# Patient Record
Sex: Male | Born: 1990 | Race: Black or African American | Hispanic: No | Marital: Single | Smoking: Never smoker
Health system: Southern US, Community
[De-identification: ages and names within clinical notes are randomized; demographics above are authoritative.]

---
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
## 2014-11-15 ENCOUNTER — Emergency Department (HOSPITAL_COMMUNITY)
Admission: EM | Admit: 2014-11-15 | Discharge: 2014-11-15 | Disposition: A | Discharge status: Home / Self Care | Payer: Self-pay | Attending: Emergency Medicine | Admitting: Emergency Medicine

## 2014-11-15 ENCOUNTER — Encounter (HOSPITAL_COMMUNITY): Payer: Self-pay | Admitting: Emergency Medicine

## 2014-11-15 DIAGNOSIS — R21 Rash and other nonspecific skin eruption: Secondary | ICD-10-CM | POA: Insufficient documentation

## 2014-11-15 DIAGNOSIS — L299 Pruritus, unspecified: Secondary | ICD-10-CM | POA: Insufficient documentation

## 2014-11-15 MED ORDER — HYDROXYZINE HCL 25 MG PO TABS: 25.0000 mg | ORAL_TABLET | Freq: Four times a day (QID) | ORAL | Status: DC

## 2014-11-15 NOTE — Discharge Instructions (Signed)
In addition, you may apply a topical hydrocortisone ointment to all affected areas except for the face.

## 2014-11-15 NOTE — ED Notes (Signed)
Pt from home for eval of itching and rash noted to bilateral upper extremities and also trunk and back, pt denies any sob or cp at this time. Reports no pain. Airway intact.

## 2014-11-15 NOTE — ED Provider Notes (Signed)
CSN: [id number]     Arrival date & time 11/15/14  1208 History  This chart was scribed for Evan EmeryNicole Caellum Mancil, PA-C, working with Evan CoreNathan Pickering, MD by Elon SpannerGarrett Cook, ED Scribe. This patient was seen in room TR07C/TR07C and the patient's care was started at 12:29 PM.   Chief Complaint  Patient presents with  . Rash   The history is provided by the patient. No language interpreter was used.   HPI Comments: Evan Herrera Kitson is a 24 y.o. male who presents to the Emergency Department complaining of an intermittently itching rash onset 2 days ago on his BLE's, chest, trunk, upper back, and lower left leg.  Patient has not taken any OTC medications.  Patient reports he recently stayed at his grandmother's house the night before onset but reports the grandmother has not had similar complaints.  Patient denies rash in mouth, palms, or soles of feet.    History reviewed. No pertinent past medical history. History reviewed. No pertinent past surgical history. No family history on file. History  Substance Use Topics  . Smoking status: Never Smoker   . Smokeless tobacco: Not on file  . Alcohol Use: No    Review of Systems  Skin: Positive for rash.  All other systems reviewed and are negative.     Allergies  Review of patient's allergies indicates no known allergies.  Home Medications   Prior to Admission medications   Not on File   BP 132/79 mmHg  Pulse 64  Temp(Src) 98 F (36.7 C) (Oral)  Resp 16  Ht 5\' 9"  (1.753 m)  Wt 150 lb (68.04 kg)  BMI 22.14 kg/m2  SpO2 100% Physical Exam  Constitutional: He is oriented to person, place, and time. He appears well-developed and well-nourished. No distress.  HENT:  Head: Normocephalic and atraumatic.  Eyes: Conjunctivae and EOM are normal.  Neck: Neck supple. No tracheal deviation present.  Cardiovascular: Normal rate.   Pulmonary/Chest: Effort normal. No stridor. No respiratory distress.  Abdominal: Soft.  Musculoskeletal: Normal range of  motion.  Neurological: He is alert and oriented to person, place, and time.  Skin: Skin is warm and dry. Rash noted.  Raised erythematous lesions consistent with bug bites to bilateral upper extremities, lower extremities and trunk. No warmth, TTP or discharge. Lesions are blanchable; sparing palms, soles and mucous membranes   Psychiatric: He has a normal mood and affect. His behavior is normal.  Nursing note and vitals reviewed.   ED Course  Procedures (including critical care time)  DIAGNOSTIC STUDIES: Oxygen Saturation is 100% on RA, normal by my interpretation.    COORDINATION OF CARE:  12:34 PM Discussed treatment plan with patient at bedside.  Patient acknowledges and agrees with plan.    Labs Review Labs Reviewed - No data to display  Imaging Review No results found.   EKG Interpretation None      MDM   Final diagnoses:  Rash and nonspecific skin eruption    Filed Vitals:   11/15/14 1223  BP: 132/79  Pulse: 64  Temp: 98 F (36.7 C)  TempSrc: Oral  Resp: 16  Height: 5\' 9"  (1.753 m)  Weight: 150 lb (68.04 kg)  SpO2: 100%   Evan Herrera Apo is a pleasant 24 y.o. male presenting with itching and rash to exposed area of skin after patient spent the night at his grandmother's house. No other household members are affected. Explained to him that this may be bug bites, possibly bedbugs. Advised him to have his  grandmother obtain a exterminator evaluation of the home. Patient has no signs of secondary infection. Advised him to apply topical hydrocortisone ointment to the area, without a scheduled a return precautions for infection.  Evaluation does not show pathology that would require ongoing emergent intervention or inpatient treatment. Pt is hemodynamically stable and mentating appropriately. Discussed findings and plan with patient/guardian, who agrees with care plan. All questions answered. Return precautions discussed and outpatient follow up given.   Discharge  Medication List as of 11/15/2014 12:35 PM    START taking these medications   Details  hydrOXYzine (ATARAX/VISTARIL) 25 MG tablet Take 1 tablet (25 mg total) by mouth every 6 (six) hours., Starting 11/15/2014, Until Discontinued, Print       I personally performed the services described in this documentation, which was scribed in my presence. The recorded information has been reviewed and is accurate.      Evan Emery, PA-C 11/15/14 1636  Evan Core, MD 11/16/14 [phone]

## 2014-11-28 ENCOUNTER — Emergency Department (HOSPITAL_COMMUNITY)
Admission: EM | Admit: 2014-11-28 | Discharge: 2014-11-28 | Disposition: A | Discharge status: Home / Self Care | Payer: Self-pay | Attending: Emergency Medicine | Admitting: Emergency Medicine

## 2014-11-28 ENCOUNTER — Emergency Department (HOSPITAL_COMMUNITY): Payer: Self-pay

## 2014-11-28 ENCOUNTER — Encounter (HOSPITAL_COMMUNITY): Payer: Self-pay | Admitting: Physical Medicine and Rehabilitation

## 2014-11-28 DIAGNOSIS — Y998 Other external cause status: Secondary | ICD-10-CM | POA: Insufficient documentation

## 2014-11-28 DIAGNOSIS — S81812A Laceration without foreign body, left lower leg, initial encounter: Secondary | ICD-10-CM | POA: Insufficient documentation

## 2014-11-28 DIAGNOSIS — Y288XXA Contact with other sharp object, undetermined intent, initial encounter: Secondary | ICD-10-CM | POA: Insufficient documentation

## 2014-11-28 DIAGNOSIS — Y9361 Activity, american tackle football: Secondary | ICD-10-CM | POA: Insufficient documentation

## 2014-11-28 DIAGNOSIS — Z23 Encounter for immunization: Secondary | ICD-10-CM | POA: Insufficient documentation

## 2014-11-28 DIAGNOSIS — T148XXA Other injury of unspecified body region, initial encounter: Secondary | ICD-10-CM

## 2014-11-28 DIAGNOSIS — Y92321 Football field as the place of occurrence of the external cause: Secondary | ICD-10-CM | POA: Insufficient documentation

## 2014-11-28 MED ORDER — TETANUS-DIPHTH-ACELL PERTUSSIS 5-2.5-18.5 LF-MCG/0.5 IM SUSP
0.5000 mL | Freq: Once | INTRAMUSCULAR | Status: AC
  Administered 2014-11-28: 0.5 mL via INTRAMUSCULAR
  Filled 2014-11-28: qty 0.5

## 2014-11-28 NOTE — ED Notes (Signed)
Pt presents to department for evaluation of laceration to L lower leg. States he was playing football and struck leg on metal object. Occurred x2 days ago. Tetanus up to date. Pt reports increased pain to L lower leg today.

## 2014-11-28 NOTE — Discharge Instructions (Signed)
Please read and follow all provided instructions.  Your diagnoses today include:  1. Laceration of left lower leg, initial encounter   2. Open wound     Tests performed today include:  X-ray of the affected area that did not show any foreign bodies or broken bones  Vital signs. See below for your results today.   Medications prescribed:   None  Take any prescribed medications only as directed.   Home care instructions:  Follow any educational materials and wound care instructions contained in this packet.   Keep affected area above the level of your heart when possible to minimize swelling. Wash area gently twice a day with warm soapy water. Do not apply alcohol or hydrogen peroxide. Cover the area if it draining or weeping.   Follow-up instructions: Wound recheck: Return to the Emergency Department or see your primary care care doctor in 2 days for a recheck of your wound.    Return instructions:  Return to the Emergency Department if you have:  Fever  Worsening pain  Worsening swelling of the wound  Pus draining from the wound  Redness of the skin that moves away from the wound, especially if it streaks away from the affected area   Any other emergent concerns  Your vital signs today were: BP 130/76 mmHg   Pulse 71   Temp(Src) 97.9 F (36.6 C) (Oral)   Resp 18   Ht 5\' 9"  (1.753 m)   Wt 145 lb (65.772 kg)   BMI 21.40 kg/m2   SpO2 100% If your blood pressure (BP) was elevated above 135/85 this visit, please have this repeated by your doctor within one month. --------------

## 2014-11-28 NOTE — ED Provider Notes (Signed)
CSN: [id number]     Arrival date & time 11/28/14  1418 History   First MD Initiated Contact with Patient 11/28/14 1636     Chief Complaint  Patient presents with  . Laceration     (Consider location/radiation/quality/duration/timing/severity/associated sxs/prior Treatment) HPI Comments: Patient with no significant past medical history -- presents with c/o left lower leg laceration occurring approximately 48 hours ago. Plane was playing football and cut his leg on a metal gate. Patient states that he rinsed the wound out at home with peroxide. In contradiction to the nursing note, patient tells me that he is uncertain of last tetanus shot and that he has not had any increased pain in the area today. He has not noted worsening redness, worsening swelling, or pus draining from the wound. He has not had any streaking red lines on his legs and has not had any fever. He states that he comes in today for evaluation at the urging of his family. No other concerns or injury.  The history is provided by the patient.    History reviewed. No pertinent past medical history. History reviewed. No pertinent past surgical history. History reviewed. No pertinent family history. History  Substance Use Topics  . Smoking status: Never Smoker   . Smokeless tobacco: Not on file  . Alcohol Use: No    Review of Systems  Constitutional: Negative for activity change.  Musculoskeletal: Positive for myalgias (mild). Negative for back pain, joint swelling, arthralgias, gait problem and neck pain.  Skin: Positive for wound.  Neurological: Negative for weakness and numbness.      Allergies  Review of patient's allergies indicates no known allergies.  Home Medications   Prior to Admission medications   Medication Sig Start Date End Date Taking? Authorizing Provider  hydrOXYzine (ATARAX/VISTARIL) 25 MG tablet Take 1 tablet (25 mg total) by mouth every 6 (six) hours. 11/15/14   Nicole Pisciotta, PA-C   BP  130/76 mmHg  Pulse 71  Temp(Src) 97.9 F (36.6 C) (Oral)  Resp 18  Ht  (1.753 m)  Wt 145 lb (65.772 kg)  BMI 21.40 kg/m2  SpO2 100% Physical Exam  Constitutional: He appears well-developed and well-nourished.  HENT:  Head: Normocephalic and atraumatic.  Eyes: Conjunctivae are normal.  Neck: Normal range of motion. Neck supple.  Cardiovascular: Normal pulses.   Musculoskeletal: He exhibits tenderness. He exhibits no edema.  Neurological: He is alert. No sensory deficit.  Motor, sensation, and vascular distal to the injury is fully intact.   Skin: Skin is warm and dry.  Approximately 8 cm, irregular laceration with superficial abrasions inferiorly to the left anterior lower leg. Wound is full-thickness and into the subcutaneous tissues. No obvious fascial or muscle injury. Sensation intact distally.  Psychiatric: He has a normal mood and affect.  Nursing note and vitals reviewed.           ED Course  Procedures (including critical care time) Labs Review Labs Reviewed - No data to display  Imaging Review Dg Tibia/fibula Left  11/28/2014   CLINICAL DATA:  Left leg injury playing football 2 days ago, laceration along lateral aspect tibia and fibula  EXAM: LEFT TIBIA AND FIBULA - 2 VIEW  COMPARISON:  None.  FINDINGS: Two views of left tibia-fibula submitted. No acute fracture or subluxation. No radiopaque foreign body. Please note the tip of distal fibula is not included on frontal view.  IMPRESSION: Negative.   Electronically Signed   By: Natasha Mead M.D.   On:  11/28/2014 15:07     EKG Interpretation None       5:10 PM Patient seen and examined. Tdap ordered.   Vital signs reviewed and are as follows: BP 130/76 mmHg  Pulse 71  Temp(Src) 97.9 F (36.6 C) (Oral)  Resp 18  Ht 5\' 9"  (1.753 m)  Wt 145 lb (65.772 kg)  BMI 21.40 kg/m2  SpO2 100%  I recommended that we debride some of the nonviable tissue surrounding the wound. Patient is adamant that he does not  want at this done. I explained possible increased possibility of wound infection without debridement of devitalized tissue. He continues to refuse.  Nurse to clean and bandage wound.   Pt urged to return with worsening pain, worsening swelling, expanding area of redness or streaking up extremity, fever, or any other concerns. Pt verbalizes understanding and agrees with plan.   MDM   Final diagnoses:  Laceration of left lower leg, initial encounter  Open wound   Open wound, left lower leg, does not appear to be overtly infected at this time. It is too late to close the wound at this time due to risk of wound infection. Offered debridement of the devitalized tissue surrounding wound, patient adamantly refuses. No immunocompromise. No indications for antibiotic use. Encouraged recheck in 2 days. Tetanus updated. Counseled on wound care and signs and symptoms of wound infection and need to return if these occur.    Renne CriglerJoshua Yassmine Tamm, PA-C 11/28/14 1715  Elwin MochaBlair Walden, MD 11/28/14 [phone]

## 2014-11-28 NOTE — ED Notes (Signed)
Linear open wound noted to anterior left shin, bleeding controlled, no redness, swelling, or drainage noted.

## 2015-01-02 ENCOUNTER — Emergency Department (HOSPITAL_COMMUNITY)
Admission: EM | Admit: 2015-01-02 | Discharge: 2015-01-02 | Disposition: A | Discharge status: Home / Self Care | Payer: 59

## 2016-08-01 ENCOUNTER — Encounter (HOSPITAL_COMMUNITY): Payer: Self-pay | Admitting: Emergency Medicine

## 2016-08-01 ENCOUNTER — Ambulatory Visit (HOSPITAL_COMMUNITY)
Admission: EM | Admit: 2016-08-01 | Discharge: 2016-08-01 | Disposition: A | Discharge status: Home / Self Care | Payer: 59 | Attending: Emergency Medicine | Admitting: Emergency Medicine

## 2016-08-01 DIAGNOSIS — J029 Acute pharyngitis, unspecified: Secondary | ICD-10-CM | POA: Diagnosis not present

## 2016-08-01 DIAGNOSIS — Z0001 Encounter for general adult medical examination with abnormal findings: Secondary | ICD-10-CM | POA: Insufficient documentation

## 2016-08-01 DIAGNOSIS — J069 Acute upper respiratory infection, unspecified: Secondary | ICD-10-CM | POA: Diagnosis not present

## 2016-08-01 LAB — POCT RAPID STREP A: Streptococcus, Group A Screen (Direct): NEGATIVE

## 2016-08-01 MED ORDER — PREDNISONE 10 MG (21) PO TBPK: 10.0000 mg | ORAL_TABLET | Freq: Every day | ORAL | 0 refills | Status: DC

## 2016-08-01 MED ORDER — AZITHROMYCIN 250 MG PO TABS: 250.0000 mg | ORAL_TABLET | Freq: Every day | ORAL | 0 refills | Status: DC

## 2016-08-01 NOTE — ED Provider Notes (Signed)
CSN: [id number]     Arrival date & time 08/01/16  1756 History   First MD Initiated Contact with Patient 08/01/16 1911     Chief Complaint  Patient presents with  . Sore Throat   (Consider location/radiation/quality/duration/timing/severity/associated sxs/prior Treatment) Pt states that he has had nasal congestion, ear pain, throat pain since thurs. Intermit headaches none currently. Has taken day quil , and other OTC with no relief. Denies any fevers. Denies anyone at home sick.       History reviewed. No pertinent past medical history. History reviewed. No pertinent surgical history. No family history on file. Social History  Substance Use Topics  . Smoking status: Never Smoker  . Smokeless tobacco: Never Used  . Alcohol use No    Review of Systems  HENT: Positive for congestion, ear pain, mouth sores, postnasal drip, rhinorrhea, sinus pain, sinus pressure, sneezing, sore throat and trouble swallowing.   Eyes: Negative.   Respiratory: Positive for cough.   Cardiovascular: Negative.   Gastrointestinal: Negative.   Skin: Negative.   Neurological: Negative.     Allergies  Patient has no known allergies.  Home Medications   Prior to Admission medications   Medication Sig Start Date End Date Taking? Authorizing Provider  azithromycin (ZITHROMAX) 250 MG tablet Take 1 tablet (250 mg total) by mouth daily. Take first 2 tablets together, then 1 every day until finished. 08/01/16   Tobi BastosMelanie A Kathryne Ramella, NP  hydrOXYzine (ATARAX/VISTARIL) 25 MG tablet Take 1 tablet (25 mg total) by mouth every 6 (six) hours. 11/15/14   Nicole Pisciotta, PA-C  predniSONE (STERAPRED UNI-PAK 21 TAB) 10 MG (21) TBPK tablet Take 1 tablet (10 mg total) by mouth daily. Take 6 tabs by mouth daily  for 2 days, then 5 tabs for 2 days, then 4 tabs for 2 days, then 3 tabs for 2 days, 2 tabs for 2 days, then 1 tab by mouth daily for 2 days 08/01/16   Tobi BastosMelanie A Giannina Bartolome, NP   Meds Ordered and Administered this  Visit  Medications - No data to display  BP 148/77 (BP Location: Left Arm)   Pulse 74   Temp 98.2 F (36.8 C) (Oral)   Resp 17   Ht 5\' 9"  (1.753 m)   Wt 158 lb (71.7 kg)   SpO2 100%   BMI 23.33 kg/m  No data found.   Physical Exam  Constitutional: He appears well-developed and well-nourished.  HENT:  Right Ear: External ear normal.  Left Ear: External ear normal.  Enlarged TM, erythema with multiple pustula to throat, swelling noted, Enlarged lymph lateral lt ear,   Cardiovascular: Normal rate and regular rhythm.   Pulmonary/Chest: Effort normal and breath sounds normal.  Neurological: He is alert.  Skin: Skin is warm. Capillary refill takes less than 2 seconds.    Urgent Care Course   Clinical Course     Procedures (including critical care time)  Labs Review Labs Reviewed - No data to display  Imaging Review No results found.           MDM   1. Acute pharyngitis, unspecified etiology   2. Upper respiratory tract infection, unspecified type    Discussed using a netti pot or humidifier Push fluids May take tylenol or motrin PRN for pain May use OTC nasal spray      Tobi BastosMelanie A Faline Langer, NP 08/01/16 1922

## 2016-08-01 NOTE — ED Triage Notes (Signed)
Pt. Stated, I've got a bad sore throat and its making me not about to eat. Started thursday

## 2016-08-04 LAB — CULTURE, GROUP A STREP (THRC)

## 2017-04-27 ENCOUNTER — Emergency Department (HOSPITAL_COMMUNITY)
Admission: EM | Admit: 2017-04-27 | Discharge: 2017-04-27 | Disposition: A | Discharge status: Home / Self Care | Payer: No Typology Code available for payment source | Attending: Emergency Medicine | Admitting: Emergency Medicine

## 2017-04-27 ENCOUNTER — Encounter (HOSPITAL_COMMUNITY): Payer: Self-pay | Admitting: Emergency Medicine

## 2017-04-27 DIAGNOSIS — Y999 Unspecified external cause status: Secondary | ICD-10-CM | POA: Diagnosis not present

## 2017-04-27 DIAGNOSIS — M79602 Pain in left arm: Secondary | ICD-10-CM | POA: Diagnosis present

## 2017-04-27 DIAGNOSIS — Z5321 Procedure and treatment not carried out due to patient leaving prior to being seen by health care provider: Secondary | ICD-10-CM | POA: Diagnosis not present

## 2017-04-27 DIAGNOSIS — Y9389 Activity, other specified: Secondary | ICD-10-CM | POA: Insufficient documentation

## 2017-04-27 DIAGNOSIS — Y92410 Unspecified street and highway as the place of occurrence of the external cause: Secondary | ICD-10-CM | POA: Insufficient documentation
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
## 2017-05-05 ENCOUNTER — Encounter (HOSPITAL_COMMUNITY): Payer: Self-pay | Admitting: *Deleted

## 2017-05-05 ENCOUNTER — Ambulatory Visit (HOSPITAL_COMMUNITY)
Admission: EM | Admit: 2017-05-05 | Discharge: 2017-05-05 | Disposition: A | Discharge status: Home / Self Care | Payer: 59 | Attending: Family | Admitting: Family

## 2017-05-05 DIAGNOSIS — R369 Urethral discharge, unspecified: Secondary | ICD-10-CM | POA: Insufficient documentation

## 2017-05-05 DIAGNOSIS — Z202 Contact with and (suspected) exposure to infections with a predominantly sexual mode of transmission: Secondary | ICD-10-CM

## 2017-05-05 DIAGNOSIS — L608 Other nail disorders: Secondary | ICD-10-CM | POA: Insufficient documentation

## 2017-05-05 DIAGNOSIS — Z113 Encounter for screening for infections with a predominantly sexual mode of transmission: Secondary | ICD-10-CM

## 2017-05-05 DIAGNOSIS — M79674 Pain in right toe(s): Secondary | ICD-10-CM | POA: Insufficient documentation

## 2017-05-05 MED ORDER — AZITHROMYCIN 250 MG PO TABS
1000.0000 mg | ORAL_TABLET | Freq: Once | ORAL | Status: AC
  Administered 2017-05-05: 1000 mg via ORAL

## 2017-05-05 MED ORDER — CEFTRIAXONE SODIUM 250 MG IJ SOLR
INTRAMUSCULAR | Status: AC
  Filled 2017-05-05: qty 250

## 2017-05-05 MED ORDER — AZITHROMYCIN 250 MG PO TABS
ORAL_TABLET | ORAL | Status: AC
  Filled 2017-05-05: qty 4

## 2017-05-05 MED ORDER — CEFTRIAXONE SODIUM 250 MG IJ SOLR
250.0000 mg | Freq: Once | INTRAMUSCULAR | Status: AC
  Administered 2017-05-05: 250 mg via INTRAMUSCULAR

## 2017-05-05 NOTE — ED Triage Notes (Signed)
Pt  Has  painfull  r  Big  Toe    For  A  Long  Time  Denies   Any   Symptoms   The  Nail  Is   discolred     Thick  And  Callused     Pt  Also  Wants  To  Be  Checked  For  Std  He  Sates  He  May  Have  A  Discharge  But is  Not  Sure

## 2017-05-05 NOTE — Discharge Instructions (Addendum)
You were given a shot of Rocephin (antibiotic) as well as oral medication (Zithromax) to treat possible STD infection. No sexual intercourse for at least 7 days. Will follow-up pending lab results. Also recommend referral to Podiatrist (Foot doctor) for evaluation of nail and to possible start anti-fungal medication. Call today to schedule appointment ASAP for a visit.

## 2017-05-05 NOTE — ED Provider Notes (Signed)
[facility]    CSN: [id number] Arrival date & time: 05/05/17  1001     History   Chief Complaint Chief Complaint  Patient presents with  . Toe Pain    HPI Evan Herrera is a 26 y.o. male.   26 year old male presents with right toe nail discoloration and occasional pain at base of toenail for over 3 months. Nail is raised, brown and disformed. He has not tried any topical medication for condition. No other nails affected. No history of nail fungus or athlete's foot.  Other main concern is penile discharge for past few days. Denies any fever, dysuria, rash, swelling or GI symptoms. Is sexually active- does use condoms. No other partners with symptoms. Requests to be treated for STD's today. No other chronic health issues. Takes no daily medication.    The history is provided by the patient.    History reviewed. No pertinent past medical history.  There are no active problems to display for this patient.   History reviewed. No pertinent surgical history.     Home Medications    Prior to Admission medications   Not on File    Family History History reviewed. No pertinent family history.  Social History Social History  Substance Use Topics  . Smoking status: Never Smoker  . Smokeless tobacco: Never Used  . Alcohol use No     Allergies   Patient has no known allergies.   Review of Systems Review of Systems  Constitutional: Negative for activity change, appetite change, chills, fatigue and fever.  HENT: Negative for mouth sores, postnasal drip and sore throat.   Respiratory: Negative for cough, chest tightness, shortness of breath and wheezing.   Gastrointestinal: Negative for abdominal pain, diarrhea, nausea and vomiting.  Genitourinary: Positive for discharge. Negative for decreased urine volume, difficulty urinating, dysuria, flank pain, frequency, genital sores, hematuria, penile pain, penile swelling, scrotal swelling, testicular pain and  urgency.  Musculoskeletal: Negative for arthralgias, gait problem, joint swelling and myalgias.  Skin: Positive for color change. Negative for wound.  Allergic/Immunologic: Negative for immunocompromised state.  Neurological: Negative for dizziness, seizures, syncope, weakness, light-headedness, numbness and headaches.  Hematological: Negative for adenopathy. Does not bruise/bleed easily.     Physical Exam Triage Vital Signs ED Triage Vitals  Enc Vitals Group     BP 05/05/17 1018 122/70     Pulse Rate 05/05/17 1018 78     Resp 05/05/17 1018 18     Temp 05/05/17 1018 98.6 F (37 C)     Temp Source 05/05/17 1018 Oral     SpO2 05/05/17 1018 100 %     Weight --      Height --      Head Circumference --      Peak Flow --      Pain Score 05/05/17 1019 3     Pain Loc --      Pain Edu? --      Excl. in GC? --    No data found.   Updated Vital Signs BP 122/70 (BP Location: Right Arm)   Pulse 78   Temp 98.6 F (37 C) (Oral)   Resp 18   SpO2 100%   Visual Acuity Right Eye Distance:   Left Eye Distance:   Bilateral Distance:    Right Eye Near:   Left Eye Near:    Bilateral Near:     Physical Exam  Constitutional: He is oriented to person, place, and time. He  appears well-developed and well-nourished. No distress.  HENT:  Head: Normocephalic and atraumatic.  Nose: Nose normal.  Mouth/Throat: Oropharynx is clear and moist.  Eyes: Conjunctivae and EOM are normal.  Neck: Normal range of motion.  Cardiovascular: Normal rate and regular rhythm.   Pulmonary/Chest: Effort normal.  Genitourinary:  Genitourinary Comments: Patient declines genital exam  Musculoskeletal: Normal range of motion. He exhibits deformity. He exhibits no tenderness.       Right foot: There is deformity (of nail). There is normal range of motion, no tenderness and no swelling.       Feet:  Nail on great toe of right foot is yellow to brown, raised completely about 1 inch from base with dry, flaking  appearance. Slightly tender along cuticle but minimal redness and no swelling seen. Has full range of motion of foot and toes. Good pulses. Unable to check capillary refill on great toe due to nail deformity. Other toes- capillary refill is normal. No neuro deficits noted.   Neurological: He is alert and oriented to person, place, and time. He has normal strength. No sensory deficit.  Skin: Skin is warm, dry and intact. Capillary refill takes less than 2 seconds.  Psychiatric: He has a normal mood and affect. His behavior is normal. Judgment and thought content normal.     UC Treatments / Results  Labs (all labs ordered are listed, but only abnormal results are displayed) Labs Reviewed  HIV ANTIBODY (ROUTINE TESTING)  RPR  URINE CYTOLOGY ANCILLARY ONLY    EKG  EKG Interpretation None       Radiology No results found.  Procedures Procedures (including critical care time)  Medications Ordered in UC Medications  azithromycin (ZITHROMAX) tablet 1,000 mg (1,000 mg Oral Given 05/05/17 1102)  cefTRIAXone (ROCEPHIN) injection 250 mg (250 mg Intramuscular Given 05/05/17 1102)     Initial Impression / Assessment and Plan / UC Course  I have reviewed the triage vital signs and the nursing notes.  Pertinent labs & imaging results that were available during my care of the patient were reviewed by me and considered in my medical decision making (see chart for details).    Discussed possible exposure to STD. Gave Rocephin 250mg  IM now and Zithromax 1g orally now for possible Chlamydia/Gonorrhea. Recommend no intercourse or oral sex for at least 7 days. Will treat for any other STD's pending lab results. Due to appearance of nail, recommend referral to Podiatrist (information provided) for evaluation of nail and to possibly start anti-fungal medication. Patient will call today to schedule appointment ASAP for a visit.     Final Clinical Impressions(s) / UC Diagnoses   Final diagnoses:    Toenail deformity  Abnormal penile discharge  Possible exposure to STD    New Prescriptions Discharge Medication List as of 05/05/2017 11:13 AM       Controlled Substance Prescriptions [place] Controlled Substance Registry consulted? Not Applicable   Sudie Grumbling, NP 05/05/17 [phone]

## 2017-05-05 NOTE — ED Notes (Signed)
Call back number verified and updated in EPIC... Adv pt to not have SI until lab results comeback neg.... Also adv pt lab results will be on MyChart; instructions given .... Pt verb understanding.   

## 2017-05-06 LAB — HIV ANTIBODY (ROUTINE TESTING W REFLEX): HIV Screen 4th Generation wRfx: NONREACTIVE

## 2017-05-06 LAB — RPR: RPR Ser Ql: NONREACTIVE

## 2017-05-11 ENCOUNTER — Telehealth (HOSPITAL_COMMUNITY): Payer: Self-pay | Admitting: Internal Medicine

## 2017-05-11 LAB — URINE CYTOLOGY ANCILLARY ONLY
Chlamydia: NEGATIVE
Neisseria Gonorrhea: NEGATIVE
Trichomonas: POSITIVE — AB

## 2017-05-11 MED ORDER — METRONIDAZOLE 500 MG PO TABS: 2000.0000 mg | ORAL_TABLET | Freq: Once | ORAL | 0 refills | Status: AC

## 2017-05-11 NOTE — Telephone Encounter (Signed)
Clinical staff please let patient know that test for trichomonas was positive.  Rx metronidazole was sent to the pharmacy of record[facility] [address] [facility] [address]  Please refrain from sexual intercourse for 7 days to give the medicine time to work.  Sexual partners need to be notified and tested/treated.  Condoms may reduce risk of reinfection.  Recheck for further evaluation if symptoms are not improving.   LM

## 2017-06-06 ENCOUNTER — Emergency Department (HOSPITAL_COMMUNITY)
Admission: EM | Admit: 2017-06-06 | Discharge: 2017-06-06 | Disposition: A | Discharge status: Home / Self Care | Payer: Self-pay | Attending: Emergency Medicine | Admitting: Emergency Medicine

## 2017-06-06 ENCOUNTER — Emergency Department (HOSPITAL_COMMUNITY): Payer: Self-pay

## 2017-06-06 ENCOUNTER — Encounter (HOSPITAL_COMMUNITY): Payer: Self-pay | Admitting: Emergency Medicine

## 2017-06-06 DIAGNOSIS — M25561 Pain in right knee: Secondary | ICD-10-CM | POA: Insufficient documentation

## 2017-06-06 NOTE — ED Triage Notes (Signed)
Pt reports fall last night while running. C/o pain with ambulation. Pt ambulatory with limp in triage. VSS.

## 2017-06-06 NOTE — ED Provider Notes (Signed)
[facility]-EMERGENCY DEPT Provider Note   CSN: [id number] Arrival date & time: 06/06/17  1607     History   Chief Complaint Chief Complaint  Patient presents with  . Knee Injury    HPI Evan Herrera is a 26 y.o. male with no significant past medical history who presents a with chief complaint acute onset, constant right knee pain. He states that yesterday he was walking when his foot got trapped in a hole that he did not see, he heard a pop in his right knee and has experienced sharp pain since then. Pain does not radiate. He denies head injury or loss of consciousness. Denies numbness, tingling, or weakness. He states that he was able to go to sleep but when he awoke, his pain worsened and he states it is very difficult to ambulate due to the pain. Pain worsens with palpation and weightbearing. He has tried ibuprofen and a heating pad without significant relief. Denies fevers or chills.  The history is provided by the patient.    History reviewed. No pertinent past medical history.  There are no active problems to display for this patient.   History reviewed. No pertinent surgical history.     Home Medications    Prior to Admission medications   Not on File    Family History History reviewed. No pertinent family history.  Social History Social History  Substance Use Topics  . Smoking status: Never Smoker  . Smokeless tobacco: Never Used  . Alcohol use Yes     Comment: occasional     Allergies   Patient has no known allergies.   Review of Systems Review of Systems  Constitutional: Negative for chills and fever.  Musculoskeletal: Positive for arthralgias. Negative for back pain and neck pain.  Neurological: Negative for syncope, weakness, light-headedness, numbness and headaches.     Physical Exam Updated Vital Signs BP 129/81 (BP Location: Left Arm)   Pulse 98   Temp 98.6 F (37 C) (Oral)   Resp 16   Ht  (1.727 m)   Wt 54.4 kg (120 lb)   SpO2 97%    BMI 18.25 kg/m   Physical Exam  Constitutional: He appears well-developed and well-nourished. No distress.  HENT:  Head: Normocephalic and atraumatic.  Eyes: Conjunctivae are normal. Right eye exhibits no discharge. Left eye exhibits no discharge.  Neck: No JVD present. No tracheal deviation present.  Cardiovascular: Normal rate and intact distal pulses.   2+ DP/PT pulses bilaterally, Homans absent bilaterally  Pulmonary/Chest: Effort normal.  Abdominal: He exhibits no distension.  Musculoskeletal: He exhibits no edema.       Right knee: He exhibits decreased range of motion. He exhibits no effusion, no ecchymosis, no deformity, no laceration, no erythema, normal alignment, no LCL laxity, normal patellar mobility, no bony tenderness, normal meniscus and no MCL laxity. Tenderness found. Medial joint line, lateral joint line and LCL tenderness noted. No MCL and no patellar tendon tenderness noted.       Left knee: Normal.  Right knee with no varus or valgus deformity, negative anterior/posterior drawer test. Maximally tender to palpation overlying the LCL and lateral joint line. Moderate swelling in the popliteal fossa posteriorly. Somewhat limited range of motion with flexion and extension due to pain. 5/5 strength of BLE major muscle groups. Normal examination of the right Achilles tendon. No ecchymosis, warmth, or erythema. No crepitus or deformity noted. Able to tense the quadriceps tendon and flex the knee.   Neurological: He is  alert.  Fluent speech, no facial droop, sensation intact to soft touch of bilateral lower extremities. Antalgic gait, however able to heel walk and toe walk without difficulty  Skin: Skin is warm and dry. Capillary refill takes less than 2 seconds. No erythema.  Psychiatric: He has a normal mood and affect. His behavior is normal.  Nursing note and vitals reviewed.    ED Treatments / Results  Labs (all labs ordered are listed, but only abnormal results are  displayed) Labs Reviewed - No data to display  EKG  EKG Interpretation None       Radiology Dg Knee Complete 4 Views Right  Result Date: 06/06/2017 CLINICAL DATA:  Right knee pain after feeling a pop while running yesterday. EXAM: RIGHT KNEE - COMPLETE 4+ VIEW COMPARISON:  None. FINDINGS: No evidence of fracture, dislocation, or joint effusion. No evidence of arthropathy or other focal bone abnormality. Soft tissues are unremarkable. IMPRESSION: Negative. Electronically Signed   By: Obie Dredge M.D.   On: 06/06/2017 17:30    Procedures Procedures (including critical care time)  Medications Ordered in ED Medications - No data to display   Initial Impression / Assessment and Plan / ED Course  I have reviewed the triage vital signs and the nursing notes.  Pertinent labs & imaging results that were available during my care of the patient were reviewed by me and considered in my medical decision making (see chart for details).     Patient with right knee pain secondary to fall yesterday. Afebrile, vital signs are stable. He is neurovascularly intact. He is ambulatory although it is painful. Radiographs show no fracture, dislocation, and soft tissues are normal in appearance. Doubt gout, septic arthritis, or osteomyelitis in the setting of pain after trauma. No evidence of significant ligamentous injury or tear. Quadriceps tendon is intact. Compartments are soft.  RICE therapy Indicated and discussed with patient. He is stable for discharge home with crutches, knee immobilizer, and follow up with orthopedics in the next 3-4 days. Discussed indications for return to the ED. Pt verbalized understanding of and agreement with plan and is safe for discharge home at this time.  Final Clinical Impressions(s) / ED Diagnoses   Final diagnoses:  Acute pain of right knee    New Prescriptions New Prescriptions   No medications on file     Bennye Alm 06/06/17 1805    Mancel Bale, MD 06/06/17 2328

## 2017-06-06 NOTE — Discharge Instructions (Signed)
Alternate 600 mg of ibuprofen and [id number] mg of Tylenol every 3 hours as needed for pain. Do not exceed 4000 mg of Tylenol daily. Use Moist heat therapy by taking a dish rag and soaking in water then ringing out excess water then microwave for 10-15 seconds until hot but not so hot that it would burn and heat to areas of soreness for 15 minutes, multiple times a day. Use a knee sleeve and crutches to help you walk around. Follow-up with orthopedics for reevaluation of your pain if it persists in the next week. Return to the ED immediately if any concerning signs or symptoms develop such as fever, worsening swelling, redness, or weakness.

## 2017-06-06 NOTE — ED Notes (Signed)
Declined W/C at D/C and was escorted to lobby by RN. 

## 2017-06-06 NOTE — Progress Notes (Signed)
Orthopedic Tech Progress Note Patient Details:  Evan Herrera [birth year] [id number]  Ortho Devices Type of Ortho Device: Crutches, Knee Sleeve Ortho Device/Splint Location: RLE Ortho Device/Splint Interventions: Ordered, Application, Adjustment   Jennye Moccasin 06/06/2017, 6:06 PM

## 2017-06-08 ENCOUNTER — Encounter (HOSPITAL_COMMUNITY): Payer: Self-pay | Admitting: Emergency Medicine

## 2018-11-04 IMAGING — DX DG KNEE COMPLETE 4+V*R*
4 series · 4 of 4 positions shown · non-contrast
Comparison: None.

CLINICAL DATA: Right knee pain after feeling a pop while running
yesterday.

EXAM:
RIGHT KNEE - COMPLETE 4+ VIEW

[knee ap]
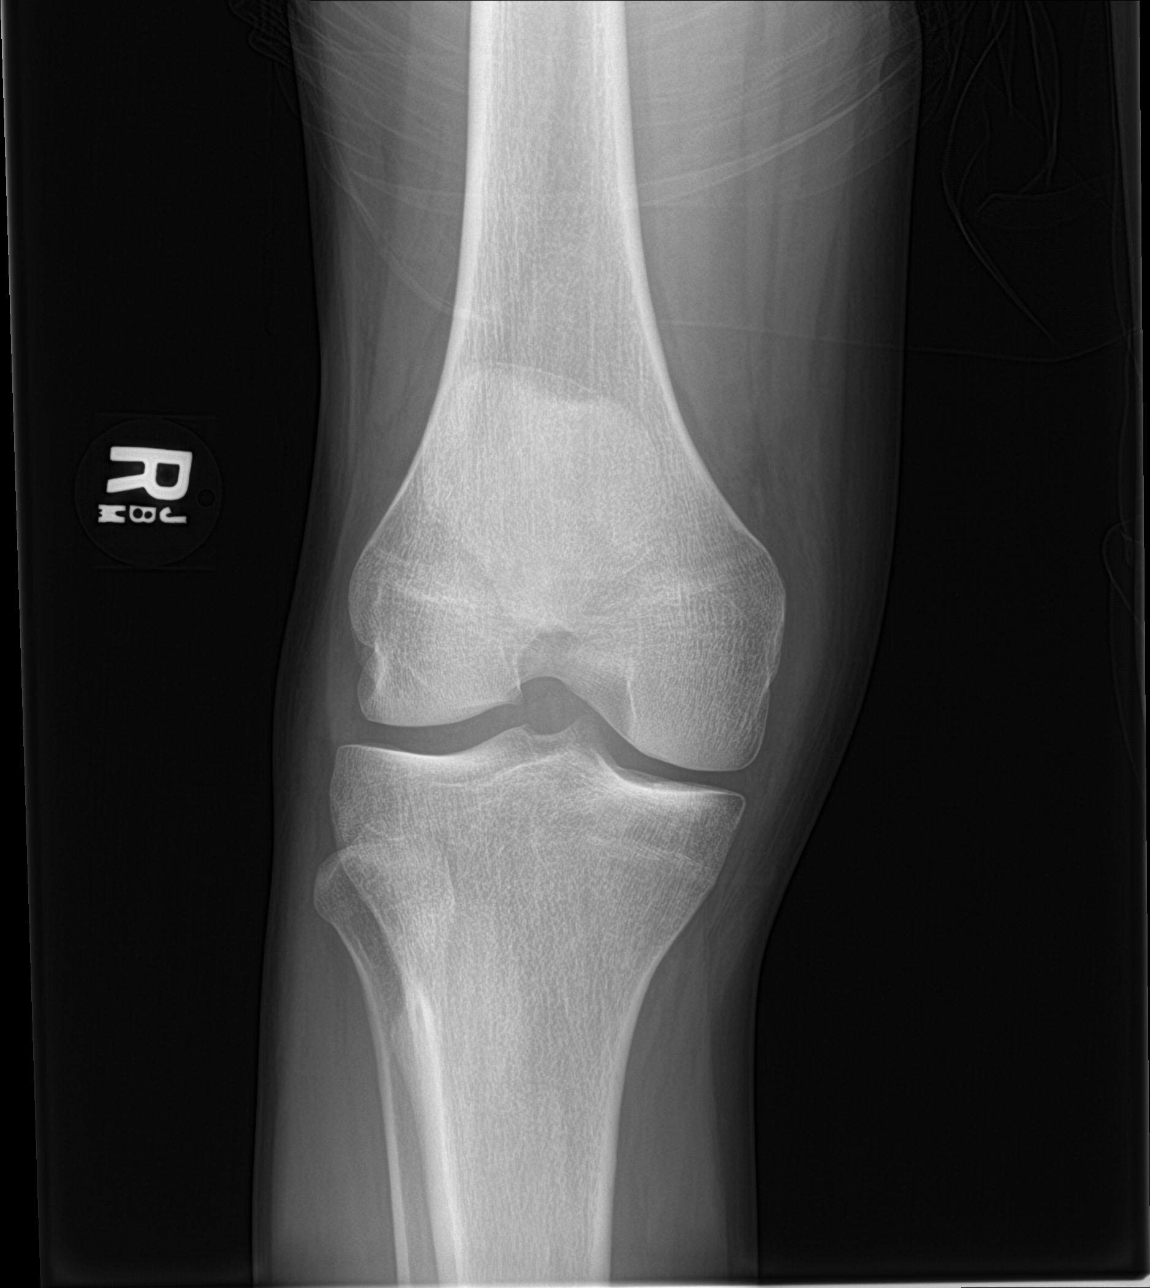

[knee lat]
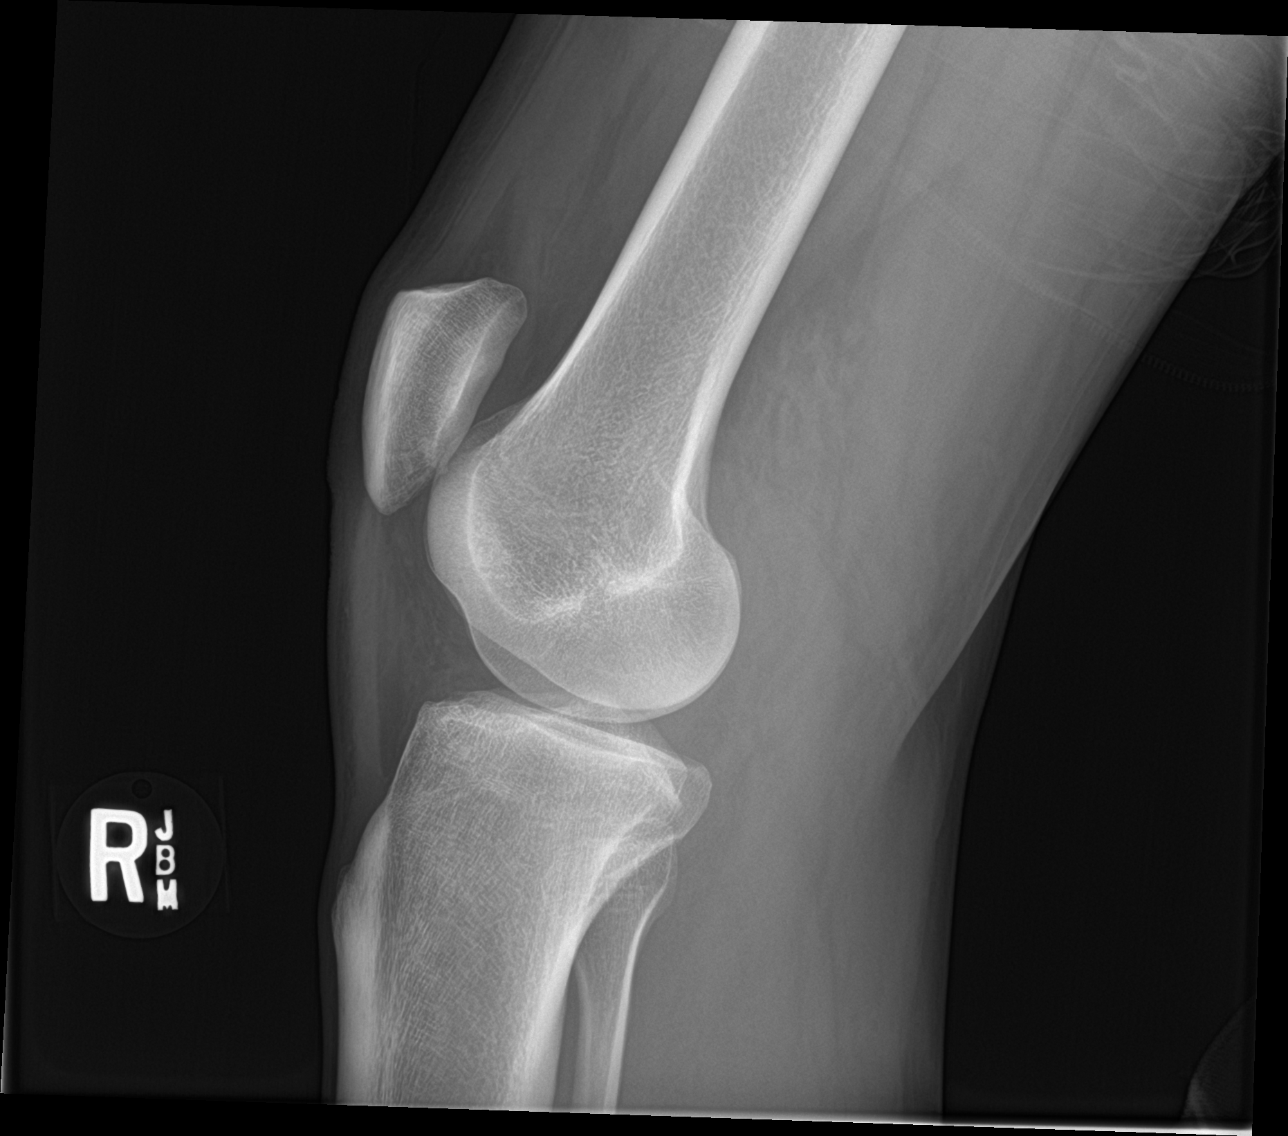

[knee obl (1 of 2)]
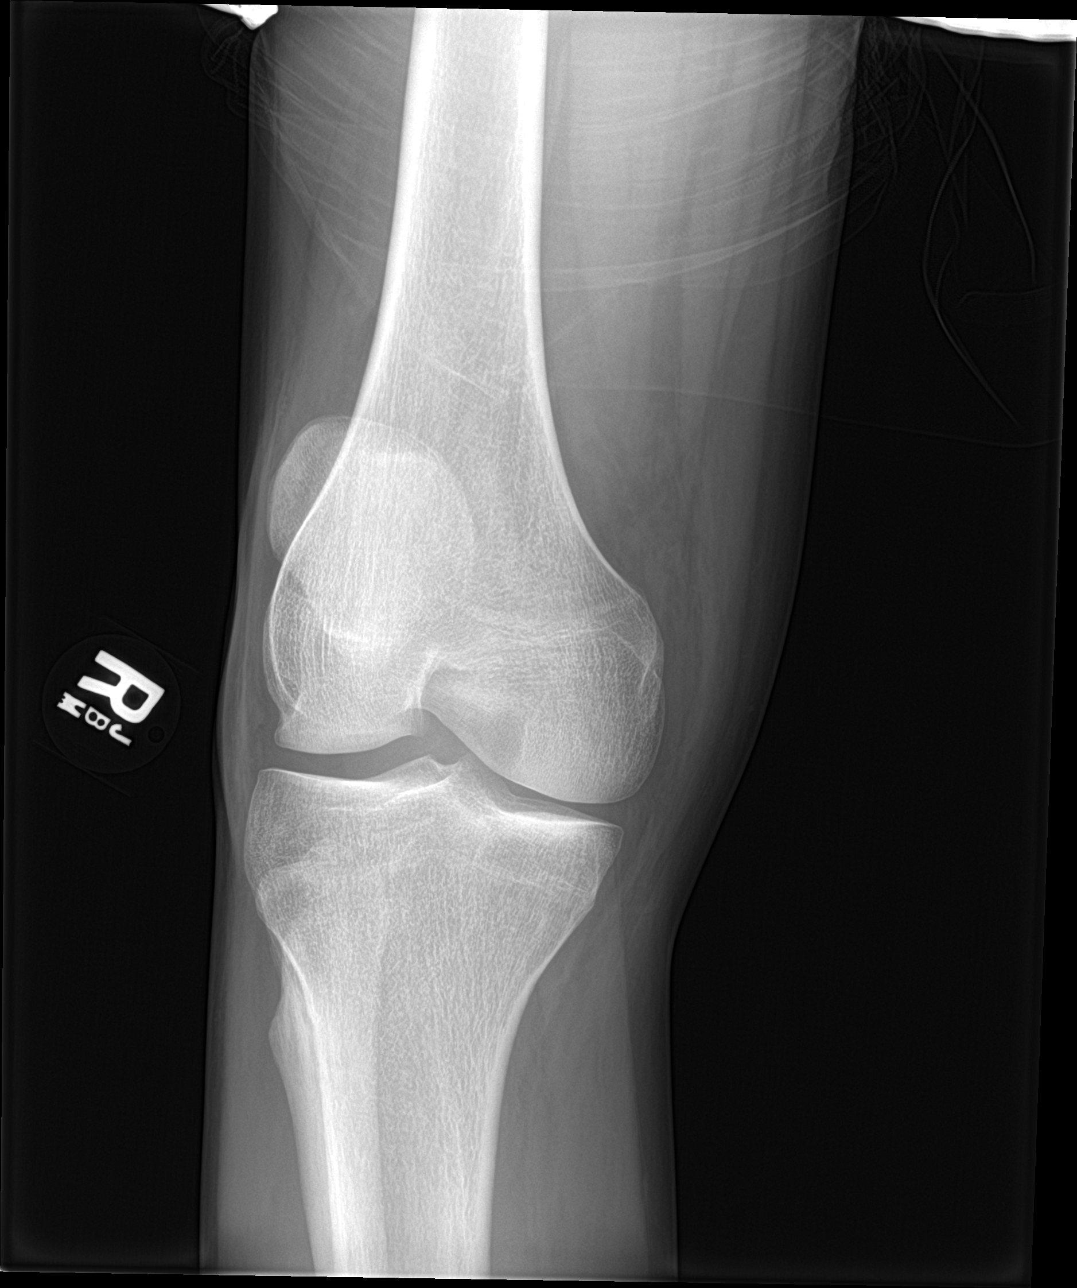

[knee obl (2 of 2)]
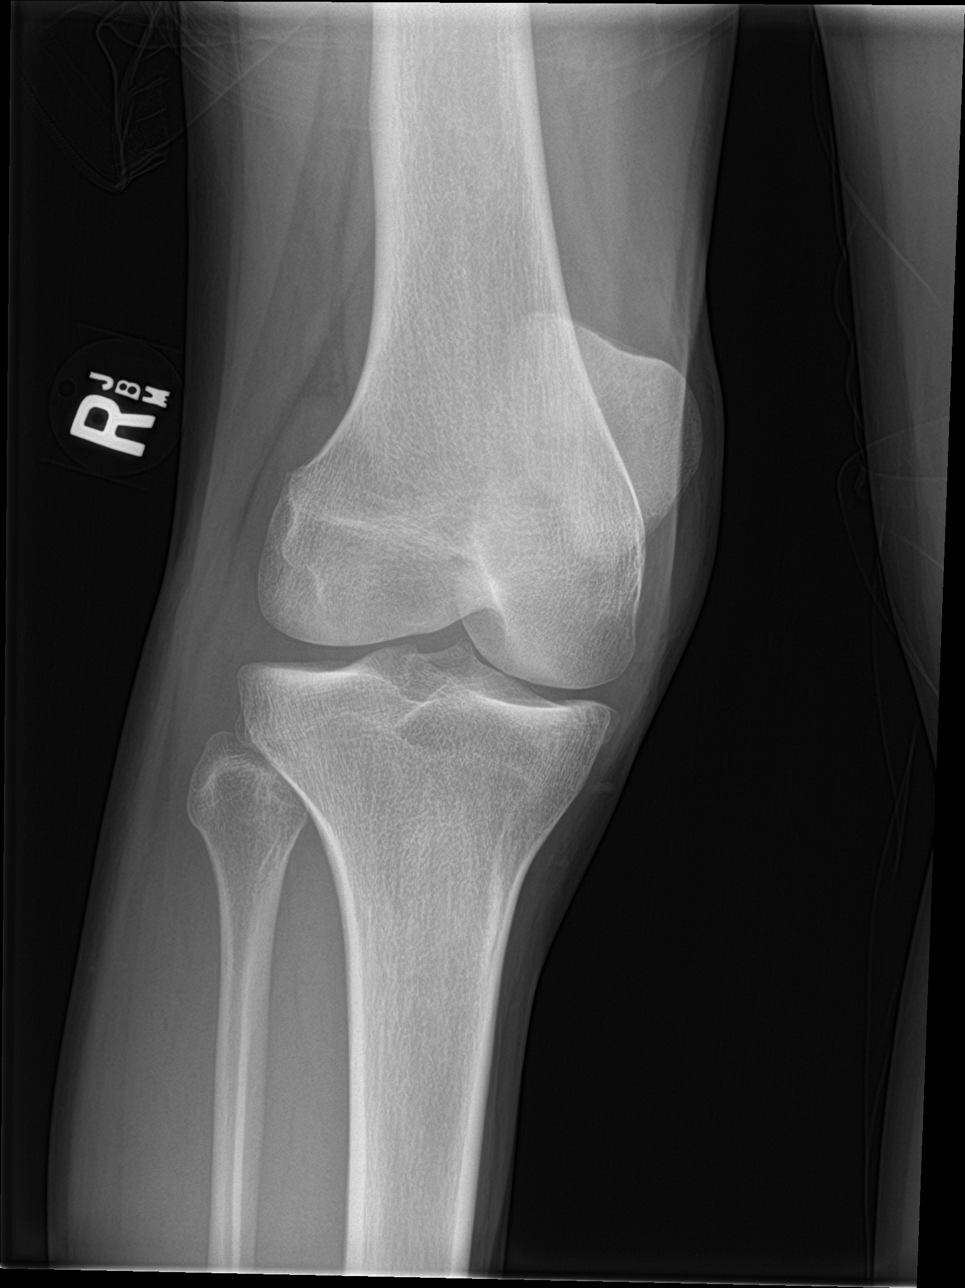

[4 of 4 positions shown; findings below may reference images not displayed]

FINDINGS: No evidence of fracture, dislocation, or joint effusion. No evidence
of arthropathy or other focal bone abnormality. Soft tissues are
unremarkable.
IMPRESSION: Negative.
